# Patient Record
Sex: Female | Born: 1950 | Race: White | Hispanic: No | Marital: Single | Smoking: Current every day smoker
Health system: Southern US, Community
[De-identification: ages and names within clinical notes are randomized; demographics above are authoritative.]

## PROBLEM LIST (undated history)

## (undated) DIAGNOSIS — I10 Essential (primary) hypertension: Secondary | ICD-10-CM

## (undated) DIAGNOSIS — D649 Anemia, unspecified: Secondary | ICD-10-CM

## (undated) DIAGNOSIS — N289 Disorder of kidney and ureter, unspecified: Secondary | ICD-10-CM

## (undated) HISTORY — PX: FRACTURE SURGERY: SHX138

## (undated) HISTORY — PX: TONSILLECTOMY: SUR1361

## (undated) HISTORY — PX: ABDOMINAL HYSTERECTOMY: SHX81

## (undated) HISTORY — PX: CHOLECYSTECTOMY: SHX55

---

## 2013-07-01 DIAGNOSIS — Z90710 Acquired absence of both cervix and uterus: Secondary | ICD-10-CM | POA: Insufficient documentation

## 2014-01-29 DIAGNOSIS — D509 Iron deficiency anemia, unspecified: Secondary | ICD-10-CM | POA: Insufficient documentation

## 2015-03-13 DIAGNOSIS — F5104 Psychophysiologic insomnia: Secondary | ICD-10-CM | POA: Insufficient documentation

## 2015-03-13 DIAGNOSIS — F17210 Nicotine dependence, cigarettes, uncomplicated: Secondary | ICD-10-CM | POA: Insufficient documentation

## 2015-09-11 DIAGNOSIS — I10 Essential (primary) hypertension: Secondary | ICD-10-CM | POA: Insufficient documentation

## 2016-03-02 DIAGNOSIS — E785 Hyperlipidemia, unspecified: Secondary | ICD-10-CM | POA: Insufficient documentation

## 2016-12-18 DIAGNOSIS — F1120 Opioid dependence, uncomplicated: Secondary | ICD-10-CM | POA: Insufficient documentation

## 2017-06-07 ENCOUNTER — Encounter: Payer: Self-pay | Admitting: Emergency Medicine

## 2017-06-07 ENCOUNTER — Emergency Department (INDEPENDENT_AMBULATORY_CARE_PROVIDER_SITE_OTHER)
Admission: EM | Admit: 2017-06-07 | Discharge: 2017-06-07 | Disposition: A | Discharge status: Home / Self Care | Payer: BLUE CROSS/BLUE SHIELD | Source: Home / Self Care | Attending: Family Medicine | Admitting: Family Medicine

## 2017-06-07 ENCOUNTER — Emergency Department (INDEPENDENT_AMBULATORY_CARE_PROVIDER_SITE_OTHER): Payer: BLUE CROSS/BLUE SHIELD

## 2017-06-07 DIAGNOSIS — X501XXA Overexertion from prolonged static or awkward postures, initial encounter: Secondary | ICD-10-CM

## 2017-06-07 DIAGNOSIS — S99929A Unspecified injury of unspecified foot, initial encounter: Secondary | ICD-10-CM

## 2017-06-07 DIAGNOSIS — S92112A Displaced fracture of neck of left talus, initial encounter for closed fracture: Secondary | ICD-10-CM

## 2017-06-07 DIAGNOSIS — S99922A Unspecified injury of left foot, initial encounter: Secondary | ICD-10-CM | POA: Diagnosis not present

## 2017-06-07 DIAGNOSIS — S92152A Displaced avulsion fracture (chip fracture) of left talus, initial encounter for closed fracture: Secondary | ICD-10-CM

## 2017-06-07 HISTORY — DX: Essential (primary) hypertension: I10

## 2017-06-07 NOTE — ED Triage Notes (Signed)
Pt fell off of porch injured left foot, now has swelling and pain

## 2017-06-07 NOTE — Discharge Instructions (Signed)
Apply ice pack for 30 minutes every 1 to 2 hours today and tomorrow.  Elevate.  Use crutches for 3 to 5 days.  Wear Ace wrap until swelling decreases.  Wear brace.  May take Tylenol as needed for pain. Recommend Vitamin D and calcium supplement.

## 2017-06-07 NOTE — ED Provider Notes (Signed)
[facility]    CSN: [id number] Arrival date & time: 06/07/17  1114     History   Chief Complaint Chief Complaint  Patient presents with  . Foot Pain    HPI Laura Bautista is a 66 y.o. female.   Patient twisted her left foot while stepping off a porch step last nigh, resulting in persistent pain in the dorsum of her left foot.   The history is provided by the patient.  Foot Pain  This is a new problem. The current episode started yesterday. The problem occurs constantly. The problem has not changed since onset.The symptoms are aggravated by walking. Nothing relieves the symptoms. Treatments tried: ice pack. The treatment provided mild relief.    Past Medical History:  Diagnosis Date  . Hypertension     There are no active problems to display for this patient.   History reviewed. No pertinent surgical history.  OB History    No data available       Home Medications    Prior to Admission medications   Medication Sig Start Date End Date Taking? Authorizing Provider  ATORVASTATIN CALCIUM PO Take by mouth.   Yes [provider]  Triamterene-HCTZ (MAXZIDE PO) Take by mouth.   Yes [provider]    Family History Family History  Problem Relation Age of Onset  . Heart disease Mother   . Heart disease Father     Social History Social History  Substance Use Topics  . Smoking status: Current Every Day Smoker    Packs/day: 0.50    Types: Cigarettes  . Smokeless tobacco: Never Used  . Alcohol use Yes     Comment: 10 drinks weekly     Allergies   Patient has no known allergies.   Review of Systems Review of Systems  All other systems reviewed and are negative.    Physical Exam Triage Vital Signs ED Triage Vitals [06/07/17 1140]  Enc Vitals Group     BP (!) 146/87     Pulse Rate 71     Resp      Temp 97.3 F (36.3 C)     Temp Source Oral     SpO2 95 %     Weight 180 lb (81.6 kg)     Height 5\' 3"  (1.6 m)   Head Circumference      Peak Flow      Pain Score 5     Pain Loc      Pain Edu?      Excl. in [place]?    No data found.   Updated Vital Signs BP (!) 146/87 (BP Location: Left Arm)   Pulse 71   Temp 97.3 F (36.3 C) (Oral)   Ht 5\' 3"  (1.6 m)   Wt 180 lb (81.6 kg)   SpO2 95%   BMI 31.89 kg/m   Visual Acuity Right Eye Distance:   Left Eye Distance:   Bilateral Distance:    Right Eye Near:   Left Eye Near:    Bilateral Near:     Physical Exam  Constitutional: She appears well-developed and well-nourished. No distress.  HENT:  Head: Atraumatic.  Eyes: Pupils are equal, round, and reactive to light.  Neck: Normal range of motion.  Cardiovascular: Normal rate.   Pulmonary/Chest: Effort normal.  Musculoskeletal:       Left foot: There is decreased range of motion, tenderness, bony tenderness and swelling. There is normal capillary refill, no deformity and no laceration.  Feet:  Dorsum of left foot has mild swelling, ecchymosis, and tenderness to palpation as noted on diagram.  Distal neurovascular function is intact.   Neurological: She is alert.  Skin: Skin is warm and dry.  Nursing note and vitals reviewed.    UC Treatments / Results  Labs (all labs ordered are listed, but only abnormal results are displayed) Labs Reviewed - No data to display  EKG  EKG Interpretation None       Radiology Dg Foot Complete Left  Result Date: 06/07/2017 CLINICAL DATA:  LEFT foot pain EXAM: LEFT FOOT - COMPLETE 3+ VIEW COMPARISON:  None. FINDINGS: On the lateral projection, there is an avulsion fragment dorsal to the neck of the talus. No additional evidence of fracture within the midfoot or forefoot. Calcaneus normal. IMPRESSION: Avulsion fracture along the neck of the talus. Electronically Signed   By: Suzy Bouchard M.D.   On: 06/07/2017 12:01    Procedures Procedures (including critical care time)  Medications Ordered in UC Medications - No data to  display   Initial Impression / Assessment and Plan / UC Course  I have reviewed the triage vital signs and the nursing notes.  Pertinent labs & imaging results that were available during my care of the patient were reviewed by me and considered in my medical decision making (see chart for details).    Ace wrap applied.  Dispensed crutches and cam walker. Apply ice pack for 30 minutes every 1 to 2 hours today and tomorrow.  Elevate.  Use crutches for 3 to 5 days.  Wear Ace wrap until swelling decreases.  Wear brace.  May take Tylenol as needed for pain. Recommend Vitamin D and calcium supplement. Followup with Dr. Aundria Mems or Dr. Lynne Leader ([facility]) as soon as possible.     Final Clinical Impressions(s) / UC Diagnoses   Final diagnoses:  Foot injury  Closed displaced fracture of neck of left talus, initial encounter    New Prescriptions New Prescriptions   No medications on file     Kandra Nicolas, MD 06/07/17 1243

## 2017-06-08 ENCOUNTER — Ambulatory Visit (INDEPENDENT_AMBULATORY_CARE_PROVIDER_SITE_OTHER): Payer: BLUE CROSS/BLUE SHIELD | Admitting: Family Medicine

## 2017-06-08 DIAGNOSIS — S92155A Nondisplaced avulsion fracture (chip fracture) of left talus, initial encounter for closed fracture: Secondary | ICD-10-CM

## 2017-06-08 DIAGNOSIS — S92109A Unspecified fracture of unspecified talus, initial encounter for closed fracture: Secondary | ICD-10-CM | POA: Insufficient documentation

## 2017-06-08 NOTE — Progress Notes (Signed)
   Subjective:    I'm seeing this patient as a consultation for:  Dr Assunta Found  CC: foot fracture  HPI: Laura Bautista is seen today to follow up fracture of the Left foot. Patient suffered an avulsion injury on June 23. She was seen in urgent care on the 24th and diagnosed with an avulsion fracture of the lateral talar neck. She was treated with a cam walker boot and crutches. She presents to clinic today with an Ace wrap on her foot and no boot and no crutches. she notes with compression ice elevation and ibuprofen her symptoms are reasonably well-controlled. she denies fevers or chills vomiting or diarrhea.   Past medical history, Surgical history, Family history not pertinant except as noted below, Social history, Allergies, and medications have been entered into the medical record, reviewed, and no changes needed.   Review of Systems: No headache, visual changes, nausea, vomiting, diarrhea, constipation, dizziness, abdominal pain, skin rash, fevers, chills, night sweats, weight loss, swollen lymph nodes, body aches, joint swelling, muscle aches, chest pain, shortness of breath, mood changes, visual or auditory hallucinations.   Objective:    Vitals:   06/08/17 1346  BP: 130/70  Pulse: 60   General: Well Developed, well nourished, and in no acute distress.  Neuro/Psych: Alert and oriented x3, extra-ocular muscles intact, able to move all 4 extremities, sensation grossly intact. Skin: Warm and dry, no rashes noted.  Respiratory: Not using accessory muscles, speaking in full sentences, trachea midline.  Cardiovascular: Pulses palpable, no extremity edema. Abdomen: Does not appear distended. MSK: Left foot swollen and tender over the dorsal midfoot. Pulses capillary refill and sensation intact distally.  No results found for this or any previous visit (from the past 24 hour(s)). Dg Foot Complete Left  Result Date: 06/07/2017 CLINICAL DATA:  LEFT foot pain EXAM: LEFT FOOT - COMPLETE 3+ VIEW  COMPARISON:  None. FINDINGS: On the lateral projection, there is an avulsion fragment dorsal to the neck of the talus. No additional evidence of fracture within the midfoot or forefoot. Calcaneus normal. IMPRESSION: Avulsion fracture along the neck of the talus. Electronically Signed   By: Suzy Bouchard M.D.   On: 06/07/2017 12:01    Impression and Recommendations:    Assessment and Plan: 66 y.o. female with Talus avulsion fracture. Plan to transition to a cam walker boot with weightbearing as tolerated. Recheck in 2 weeks. Ace wrap as needed as well..   No orders of the defined types were placed in this encounter.  No orders of the defined types were placed in this encounter.   Discussed warning signs or symptoms. Please see discharge instructions. Patient expresses understanding.

## 2017-06-08 NOTE — Patient Instructions (Signed)
Thank you for coming in today. Use the cam walker boot.  Recheck in 2 weeks.  Use ibuprofen for pain as needed.  I recommend over the counter vit D 2000 units daily as well as calcium 1000mg  twice daily.  You can use crutches as needed for pain as needed.    Tarsal Fracture A tarsal fracture is a break in one of the bones that are located in the middle and back of your foot (tarsals). There are seven tarsal bones in your foot that make up your heel, the arch of your foot, and connections to the long bones of your toes. Types of tarsal fractures include:  Cracks in a bone (stress fracture). This is most common in the tarsal bones of the heel and top of the foot.  A completely broken bone that has not moved out of place (nondisplaced fracture).  A completely broken bone that has moved out of place (displaced fracture).  Multiple breaks resulting in three or more bone pieces (comminuted fracture).  What are the causes? This condition may be caused by:  Repeated stress on the tarsal bones over time.  An injury that forcefully twists your foot.  Falling from a height.  A hard, direct hit or a crushing injury to your foot.  What increases the risk? This condition is more likely to develop in people who have weak bones (osteopenia orosteoporosis). You may also be at greater risk for a stress fracture if you start a new athletic activity or try to advance too quickly. Tarsal fractures are also more common in athletes who participate in:  Soccer.  Basketball.  Track or cross country.  Dancing.  Gymnastics.  Football.  What are the signs or symptoms? Foot pain is the most common symptom of this condition. The pain is typically:  Felt in the middle of your foot.  Described as achy, dull, or vague.  Better with rest and worse with activity.  Increased when you lean your body weight on your foot or rise up on tiptoe.  Other symptoms include:  Foot  swelling.  Bruising.  Pain when pressing on the top or bottom of your foot (tenderness).  How is this diagnosed? This condition is diagnosed based on your symptoms and medical history, especially if you recently had an injury. Your health care provider will also do a physical exam to check for bruising, swelling, and painful movement of your foot. You may be asked if you can stand up on tiptoe or hop. Diagnosis is usually confirmed by:  X-rays to check for a complete, displaced, or comminuted fracture.  MRI or another imaging study if your health care provider suspects a stress fracture but cannot see it on an X-ray.  How is this treated? The first treatments for stress fractures and nondisplaced fractures are usually nonsurgical. These may include:  Using a boot or cast to keep your foot still (immobilization) and protect it while it heals.  Using crutches to keep weight off your foot.  Physical therapy to improve motion and strength.  Medicine for pain.  If you have a displaced or comminuted fracture that makes your foot unstable, you may need surgery. This may include using screws, wires, or plates to provide stability. After surgery, you will need to wear a cast and eventually have physical therapy. Follow these instructions at home: If you have a boot:  Wear the boot as told by your health care provider. Remove it only as told by your health care provider.  Loosen the boot if your toes tingle, become numb, or turn cold and blue.  Do not let your boot get wet if it is not waterproof.  Keep the boot clean. If you have a cast:  Do not stick anything inside the cast to scratch your skin. Doing that increases your risk of infection.  Check the skin around the cast every day. Tell your health care provider about any concerns.  You may put lotion on dry skin around the edges of the cast. Do not put lotion on the skin underneath the cast.  Do not let your cast get wet if it is  not waterproof.  Keep the cast clean. Bathing  Do not take baths, swim, or use a hot tub until your health care provider approves. Ask your health care provider if you can take showers. You may only be allowed to take sponge baths for bathing.  If your boot or cast is not waterproof, protect it with a watertight covering when you take a bath or a shower. Managing pain, stiffness, and swelling  If directed, apply ice to the injured area. ? Put ice in a plastic bag. ? Place a towel between the bag and your boot, cast, or skin. ? Leave the ice on for 20 minutes, 2-3 times a day.  Move your toes often to avoid stiffness and to lessen swelling.  Raise (elevate) the injured area above the level of your heart while you are sitting or lying down. Driving  Do not drive or operate heavy machinery while taking prescription pain medicine.  Ask your health care provider when it is safe to drive if you have a boot or cast on your foot. Activity  Return to your normal activities as told by your health care provider. Ask your health care provider what activities are safe for you.  Do exercises as told by your health care provider. Safety  Do not use the injured limb to support your body weight until your health care provider says that you can. Use your crutches as told by your health care provider. General instructions  Do not put pressure on any part of the cast until it is fully hardened. This may take several hours.  Do not use any tobacco products, such as cigarettes, chewing tobacco, and e-cigarettes. Tobacco can delay healing. If you need help quitting, ask your health care provider.  Take over-the-counter medicines, prescription medicines, and vitamin supplements only as told by your health care provider.  Keep all follow-up visits as told by your health care provider. This is important. How is this prevented?  Give your body time to rest between periods of activity.  Wear  comfortable and supportive footwear when being active.  If you are starting a new activity, build your time or distance gradually.  Make sure to use equipment that fits you.  Be safe and responsible while being active to avoid falls.  Maintain physical fitness, including: ? Strength. ? Flexibility.  Participate in alternative physical activities (cross train) to avoid over-stressing one area of your body.  Include foods that contain calcium and vitamin D as part of a balanced diet to support strong bones. These include milk, beef liver, egg yolks, fatty fish, soybeans, dark leafy greens, cheese, and fortified cereals. Contact a health care provider if:  Your pain medicine is not helping.  You cannot do your home care exercises because of pain or stiffness.  Your boot or cast gets damaged. Get help right away if:  You have severe pain.  Your toes turn pale and cold or blue.  You lose feeling in your toes (have numbness).  You have pain, redness, warmth, and tenderness in the back of your leg.  You have chest pain or difficulty breathing. This information is not intended to replace advice given to you by your health care provider. Make sure you discuss any questions you have with your health care provider. Document Released: 12/01/2005 Document Revised: 08/06/2016 Document Reviewed: 11/13/2015 Elsevier Interactive Patient Education  [employer].

## 2017-06-22 ENCOUNTER — Ambulatory Visit (INDEPENDENT_AMBULATORY_CARE_PROVIDER_SITE_OTHER): Payer: BLUE CROSS/BLUE SHIELD

## 2017-06-22 ENCOUNTER — Encounter: Payer: Self-pay | Admitting: Family Medicine

## 2017-06-22 ENCOUNTER — Ambulatory Visit (INDEPENDENT_AMBULATORY_CARE_PROVIDER_SITE_OTHER): Payer: BLUE CROSS/BLUE SHIELD | Admitting: Family Medicine

## 2017-06-22 VITALS — BP 133/69 | HR 79 | Wt 182.0 lb

## 2017-06-22 DIAGNOSIS — S92155D Nondisplaced avulsion fracture (chip fracture) of left talus, subsequent encounter for fracture with routine healing: Secondary | ICD-10-CM

## 2017-06-22 DIAGNOSIS — S92155A Nondisplaced avulsion fracture (chip fracture) of left talus, initial encounter for closed fracture: Secondary | ICD-10-CM | POA: Diagnosis not present

## 2017-06-22 DIAGNOSIS — X58XXXD Exposure to other specified factors, subsequent encounter: Secondary | ICD-10-CM

## 2017-06-22 NOTE — Patient Instructions (Signed)
Thank you for coming in today. Continue the walking boot.  Recheck in 2-3 weeks.  Ok to start to transition to an ankle brace as guided by pain.

## 2017-06-22 NOTE — Progress Notes (Signed)
   Kathyrn Warmuth is a 66 y.o. female who presents to [facility] today for follow-up foot fracture. Patient was seen June 25 for nondisplaced avulsion fracture of the left talus. She is doing quite well and notes minimal pain. She is using the cam walker boot intermittently. At home she will often just use an ASO type brace with a shoe. She notes that bruising and pain are much improved.   Past Medical History:  Diagnosis Date  . Hypertension    No past surgical history on file. Social History  Substance Use Topics  . Smoking status: Current Every Day Smoker    Packs/day: 0.50    Types: Cigarettes  . Smokeless tobacco: Never Used  . Alcohol use Yes     Comment: 10 drinks weekly     ROS:  As above   Medications: Current Outpatient Prescriptions  Medication Sig Dispense Refill  . ATORVASTATIN CALCIUM PO Take by mouth.    . ferrous sulfate 325 (65 FE) MG tablet Take 325 mg by mouth daily.    . Triamterene-HCTZ (MAXZIDE PO) Take by mouth.     No current facility-administered medications for this visit.    No Known Allergies   Exam:  BP 133/69   Pulse 79   Wt 182 lb (82.6 kg)   SpO2 98%   BMI 32.24 kg/m  General: Well Developed, well nourished, and in no acute distress.  Neuro/Psych: Alert and oriented x3, extra-ocular muscles intact, able to move all 4 extremities, sensation grossly intact. Skin: Warm and dry, no rashes noted.  Respiratory: Not using accessory muscles, speaking in full sentences, trachea midline.  Cardiovascular: Pulses palpable, no extremity edema. Abdomen: Does not appear distended. MSK: Left foot: Resolving ecchymosis. Nontender. Some pain with eversion. Pulses capillary refill and sensation are intact.  X-ray left foot shows stable appearance of avulsion fracture with no significant healing. Awaiting formal radiology review.    No results found for this or any previous visit (from the past 48  hour(s)). No results found.    Assessment and Plan: 66 y.o. female with left foot talus fracture. Plan to continue the boot as needed. Slowly transition to ASO. Recheck in 2-3 weeks.    Orders Placed This Encounter  Procedures  . DG Foot Complete Left    Standing Status:   Future    Number of Occurrences:   1    Standing Expiration Date:   08/23/2018    Order Specific Question:   Reason for Exam (SYMPTOM  OR DIAGNOSIS REQUIRED)    Answer:   f.u x    Order Specific Question:   Preferred imaging location?    Answer:   [facility]    Order Specific Question:   Radiology Contrast Protocol - do NOT remove file path    Answer:   [email or web address]   Meds ordered this encounter  Medications  . ferrous sulfate 325 (65 FE) MG tablet    Sig: Take 325 mg by mouth daily.    Discussed warning signs or symptoms. Please see discharge instructions. Patient expresses understanding.

## 2017-07-06 ENCOUNTER — Ambulatory Visit (INDEPENDENT_AMBULATORY_CARE_PROVIDER_SITE_OTHER): Payer: BLUE CROSS/BLUE SHIELD | Admitting: Family Medicine

## 2017-07-06 ENCOUNTER — Ambulatory Visit (INDEPENDENT_AMBULATORY_CARE_PROVIDER_SITE_OTHER): Payer: BLUE CROSS/BLUE SHIELD

## 2017-07-06 ENCOUNTER — Encounter: Payer: Self-pay | Admitting: Family Medicine

## 2017-07-06 VITALS — BP 132/75 | HR 69 | Wt 182.0 lb

## 2017-07-06 DIAGNOSIS — S92155D Nondisplaced avulsion fracture (chip fracture) of left talus, subsequent encounter for fracture with routine healing: Secondary | ICD-10-CM | POA: Diagnosis not present

## 2017-07-06 DIAGNOSIS — S92155A Nondisplaced avulsion fracture (chip fracture) of left talus, initial encounter for closed fracture: Secondary | ICD-10-CM

## 2017-07-06 DIAGNOSIS — X501XXD Overexertion from prolonged static or awkward postures, subsequent encounter: Secondary | ICD-10-CM | POA: Diagnosis not present

## 2017-07-06 NOTE — Patient Instructions (Signed)
Thank you for coming in today. Things are looking good.  Continue the ankle brace with activity for 1 month.  Recheck in 1 month.  Work on ankle motion exercises at home.     Ankle Sprain, Phase I Rehab Ask your health care provider which exercises are safe for you. Do exercises exactly as told by your health care provider and adjust them as directed. It is normal to feel mild stretching, pulling, tightness, or discomfort as you do these exercises, but you should stop right away if you feel sudden pain or your pain gets worse.Do not begin these exercises until told by your health care provider. Stretching and range of motion exercises These exercises warm up your muscles and joints and improve the movement and flexibility of your lower leg and ankle. These exercises also help to relieve pain and stiffness. Exercise A: Gastroc and soleus stretch  1. Sit on the floor with your left / right leg extended. 2. Loop a belt or towel around the ball of your left / right foot. The ball of your foot is on the walking surface, right under your toes. 3. Keep your left / right ankle and foot relaxed and keep your knee straight while you use the belt or towel to pull your foot toward you. You should feel a gentle stretch behind your calf or knee. 4. Hold this position for __________ seconds, then release to the starting position. Repeat the exercise with your knee bent. You can put a pillow or a rolled bath towel under your knee to support it. You should feel a stretch deep in your calf or at your Achilles tendon. Repeat each stretch __________ times. Complete these stretches __________ times a day. Exercise B: Ankle alphabet  1. Sit with your left / right leg supported at the lower leg. ? Do not rest your foot on anything. ? Make sure your foot has room to move freely. 2. Think of your left / right foot as a paintbrush, and move your foot to trace each letter of the alphabet in the air. Keep your hip and  knee still while you trace. Make the letters as large as you can without feeling discomfort. 3. Trace every letter from A to Z. Repeat __________ times. Complete this exercise __________ times a day. Strengthening exercises These exercises build strength and endurance in your ankle and lower leg. Endurance is the ability to use your muscles for a long time, even after they get tired. Exercise C: Dorsiflexors  1. Secure a rubber exercise band or tube to an object, such as a table leg, that will stay still when the band is pulled. Secure the other end around your left / right foot. 2. Sit on the floor facing the object, with your left / right leg extended. The band or tube should be slightly tense when your foot is relaxed. 3. Slowly bring your foot toward you, pulling the band tighter. 4. Hold this position for __________ seconds. 5. Slowly return your foot to the starting position. Repeat __________ times. Complete this exercise __________ times a day. Exercise D: Plantar flexors  1. Sit on the floor with your left / right leg extended. 2. Loop a rubber exercise tube or band around the ball of your left / right foot. The ball of your foot is on the walking surface, right under your toes. ? Hold the ends of the band or tube in your hands. ? The band or tube should be slightly tense when  your foot is relaxed. 3. Slowly point your foot and toes downward, pushing them away from you. 4. Hold this position for __________ seconds. 5. Slowly return your foot to the starting position. Repeat __________ times. Complete this exercise __________ times a day. Exercise E: Evertors 1. Sit on the floor with your legs straight out in front of you. 2. Loop a rubber exercise band or tube around the ball of your left / right foot. The ball of your foot is on the walking surface, right under your toes. ? Hold the ends of the band in your hands, or secure the band to a stable object. ? The band or tube should  be slightly tense when your foot is relaxed. 3. Slowly push your foot outward, away from your other leg. 4. Hold this position for __________ seconds. 5. Slowly return your foot to the starting position. Repeat __________ times. Complete this exercise __________ times a day. This information is not intended to replace advice given to you by your health care provider. Make sure you discuss any questions you have with your health care provider. Document Released: 07/02/2005 Document Revised: 08/07/2016 Document Reviewed: 10/15/2015 Elsevier Interactive Patient Education  2018 [employer].

## 2017-07-06 NOTE — Progress Notes (Signed)
   Laura Bautista is a 66 y.o. female who presents to [facility] today for follow-up left anterior talus avulsion fracture.  Patient was originally seen June 24 for anterior talus avulsion fracture. In the last several weeks she's been using an ASO ankle brace. She notes mild soreness but overall feels much better. She denies any repeat injury or significant swelling.   Past Medical History:  Diagnosis Date  . Hypertension    No past surgical history on file. Social History  Substance Use Topics  . Smoking status: Current Every Day Smoker    Packs/day: 0.50    Types: Cigarettes  . Smokeless tobacco: Never Used  . Alcohol use Yes     Comment: 10 drinks weekly     ROS:  As above   Medications: Current Outpatient Prescriptions  Medication Sig Dispense Refill  . ATORVASTATIN CALCIUM PO Take by mouth.    . ferrous sulfate 325 (65 FE) MG tablet Take 325 mg by mouth daily.    . Triamterene-HCTZ (MAXZIDE PO) Take by mouth.     No current facility-administered medications for this visit.    No Known Allergies   Exam:  BP 132/75   Pulse 69   Wt 182 lb (82.6 kg)   SpO2 98%   BMI 32.24 kg/m  General: Well Developed, well nourished, and in no acute distress.  Neuro/Psych: Alert and oriented x3, extra-ocular muscles intact, able to move all 4 extremities, sensation grossly intact. Skin: Warm and dry, no rashes noted.  Respiratory: Not using accessory muscles, speaking in full sentences, trachea midline.  Cardiovascular: Pulses palpable, no extremity edema. Abdomen: Does not appear distended. MSK: Left ankle no swelling nontender normal motion stable ligamentous exam. Pulses capillary refill and sensation intact.   X-ray left foot shows healing with visible fracture line anterior talus avulsion fragment. Awaiting formal radiology review.   Assessment and Plan: 66 y.o. female with healing anterior talus avulsion fracture. Doing  well. Continue ASO brace. Work on home range of motion exercises. Recheck in one month.    Orders Placed This Encounter  Procedures  . DG Foot Complete Left    Standing Status:   Future    Number of Occurrences:   1    Standing Expiration Date:   09/06/2018    Order Specific Question:   Reason for Exam (SYMPTOM  OR DIAGNOSIS REQUIRED)    Answer:   eval fx    Order Specific Question:   Preferred imaging location?    Answer:   [facility]    Order Specific Question:   Radiology Contrast Protocol - do NOT remove file path    Answer:   [email or web address]   No orders of the defined types were placed in this encounter.   Discussed warning signs or symptoms. Please see discharge instructions. Patient expresses understanding.

## 2017-08-06 ENCOUNTER — Ambulatory Visit: Payer: BLUE CROSS/BLUE SHIELD | Admitting: Family Medicine

## 2017-08-14 DIAGNOSIS — M8580 Other specified disorders of bone density and structure, unspecified site: Secondary | ICD-10-CM | POA: Insufficient documentation

## 2017-08-15 IMAGING — DX DG FOOT COMPLETE 3+V*L*
3 series · 3 of 3 positions shown · non-contrast
Comparison: Prior radiographs 06/22/2017 and 06/07/2017

CLINICAL DATA: 65-year-old female with a history of left talus
fracture

EXAM:
LEFT FOOT - COMPLETE 3+ VIEW

[foot ap]
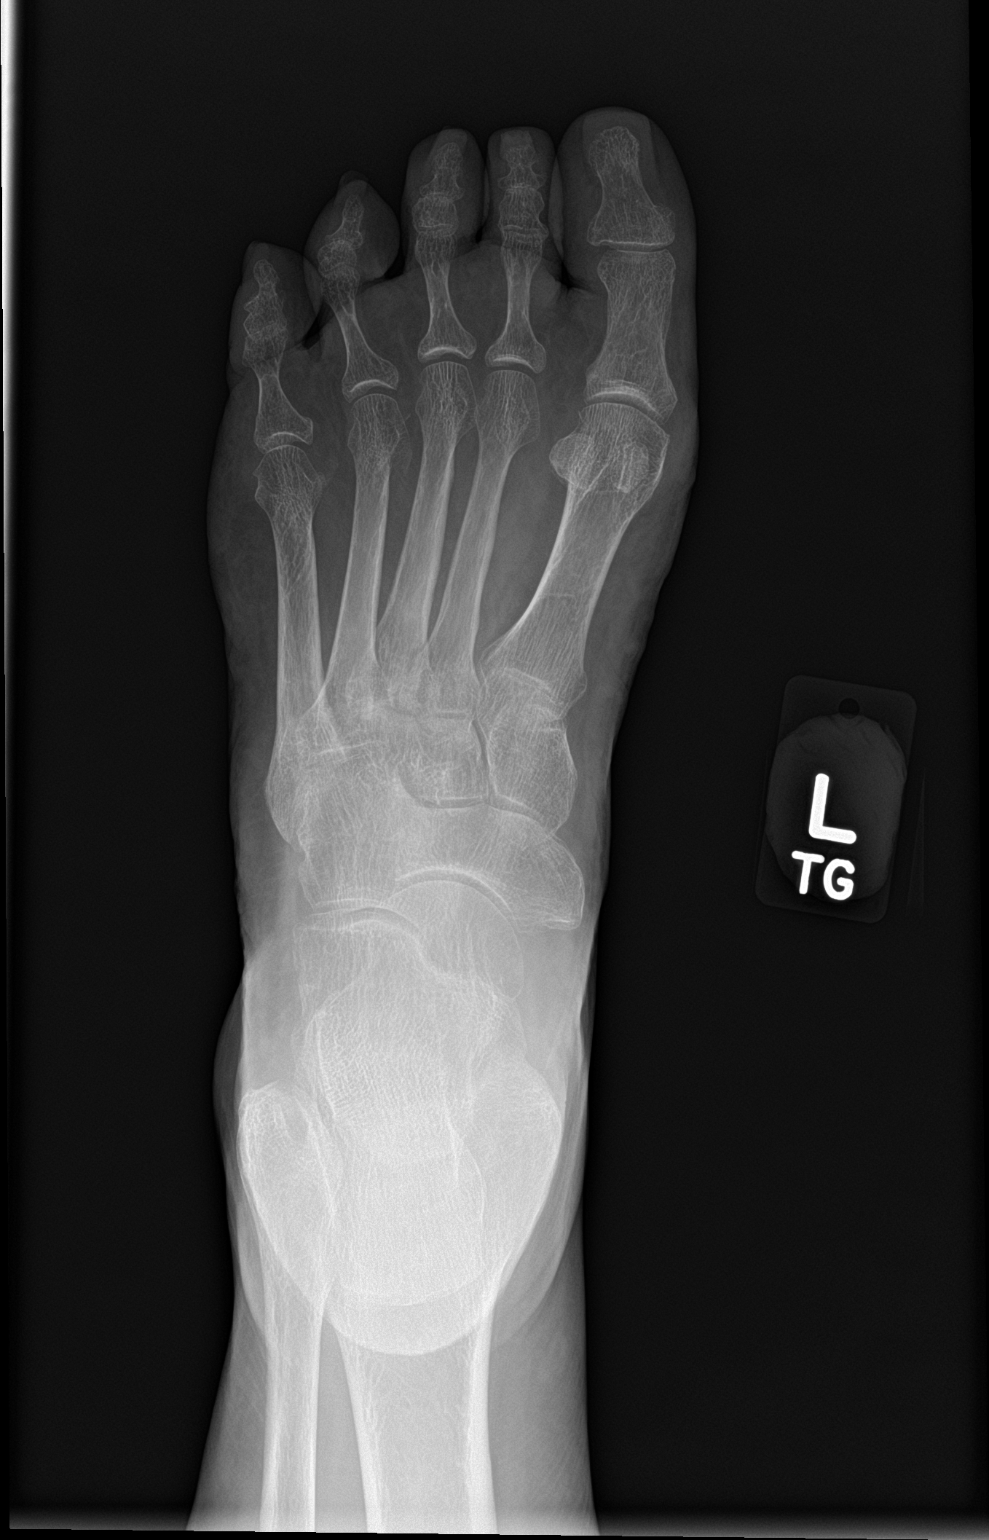

[foot obl]
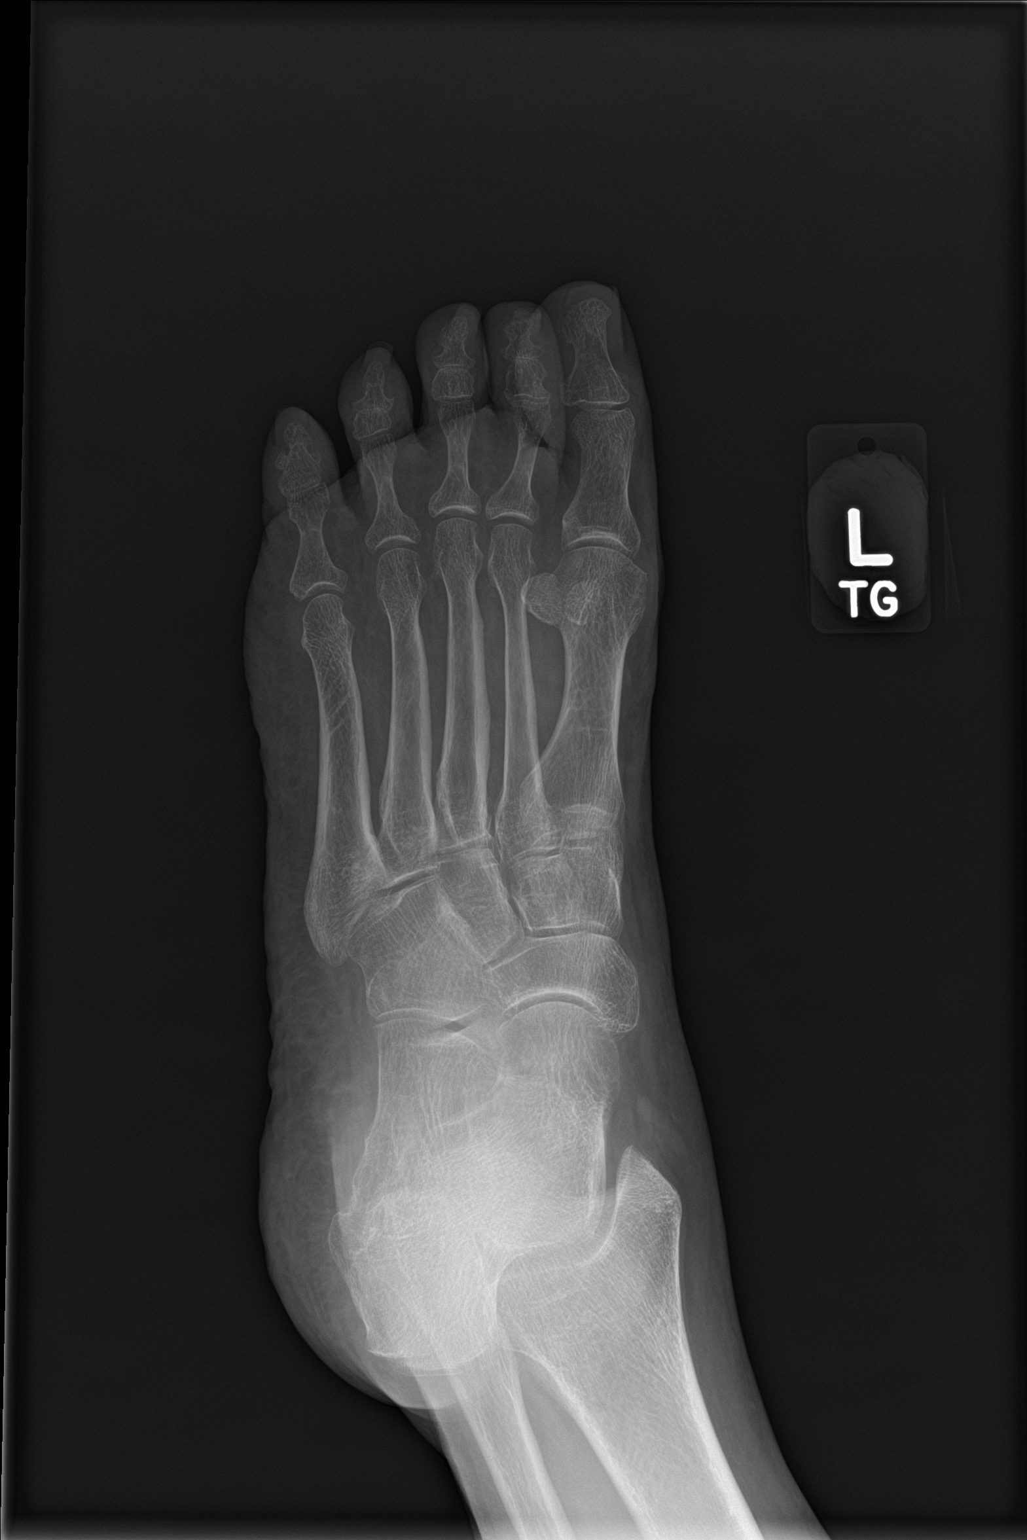

[foot lat]
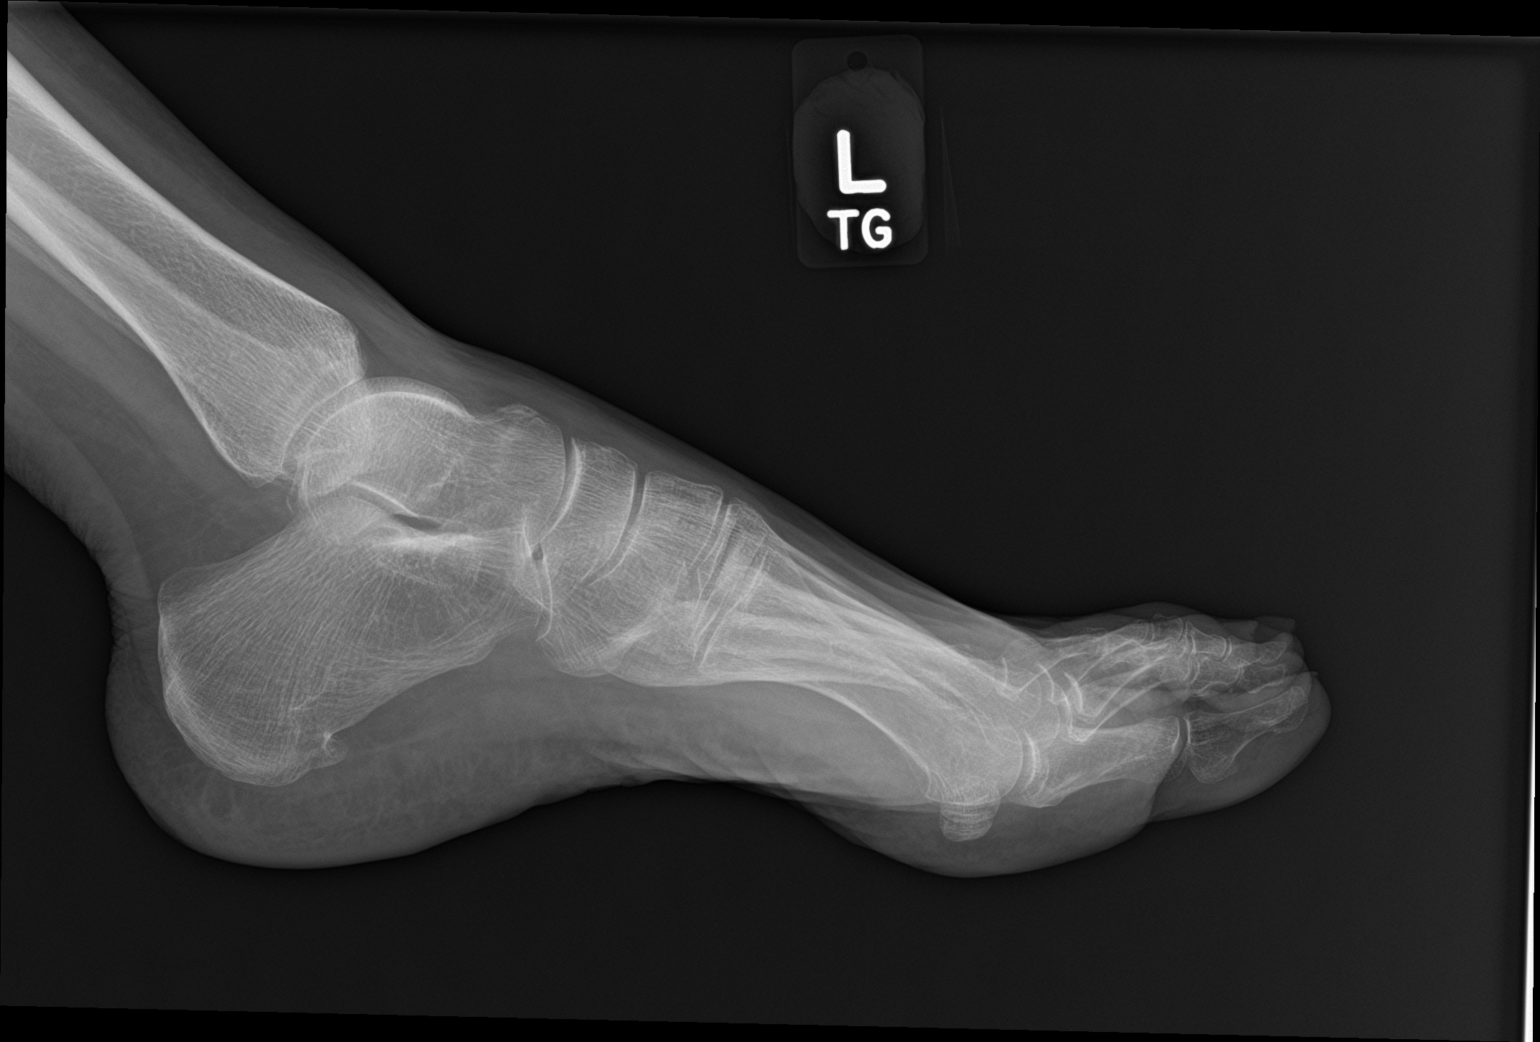

[3 of 3 positions shown; findings below may reference images not displayed]

FINDINGS: Healing avulsion fracture from the dorsal aspect of the neck of the
talus. No new fracture or malalignment identified. No focal soft
tissue swelling or abnormality. No lytic or blastic osseous lesion.
IMPRESSION: Healing avulsion fracture from the dorsal aspect of the neck of the
talus.

## 2018-08-12 DIAGNOSIS — E559 Vitamin D deficiency, unspecified: Secondary | ICD-10-CM | POA: Insufficient documentation

## 2019-07-27 DIAGNOSIS — C44310 Basal cell carcinoma of skin of unspecified parts of face: Secondary | ICD-10-CM | POA: Insufficient documentation

## 2019-10-07 ENCOUNTER — Other Ambulatory Visit: Payer: Self-pay

## 2019-10-07 DIAGNOSIS — Z20822 Contact with and (suspected) exposure to covid-19: Secondary | ICD-10-CM

## 2019-10-08 LAB — NOVEL CORONAVIRUS, NAA: SARS-CoV-2, NAA: NOT DETECTED

## 2020-02-01 DIAGNOSIS — N183 Chronic kidney disease, stage 3 unspecified: Secondary | ICD-10-CM | POA: Insufficient documentation

## 2020-09-13 ENCOUNTER — Other Ambulatory Visit: Payer: Self-pay

## 2020-09-13 ENCOUNTER — Emergency Department (INDEPENDENT_AMBULATORY_CARE_PROVIDER_SITE_OTHER)
Admission: EM | Admit: 2020-09-13 | Discharge: 2020-09-13 | Disposition: A | Discharge status: Home / Self Care | Payer: Medicare Other | Source: Home / Self Care

## 2020-09-13 ENCOUNTER — Encounter: Payer: Self-pay | Admitting: Emergency Medicine

## 2020-09-13 DIAGNOSIS — R519 Headache, unspecified: Secondary | ICD-10-CM

## 2020-09-13 DIAGNOSIS — R52 Pain, unspecified: Secondary | ICD-10-CM

## 2020-09-13 DIAGNOSIS — R110 Nausea: Secondary | ICD-10-CM

## 2020-09-13 DIAGNOSIS — M1990 Unspecified osteoarthritis, unspecified site: Secondary | ICD-10-CM | POA: Insufficient documentation

## 2020-09-13 DIAGNOSIS — Z20822 Contact with and (suspected) exposure to covid-19: Secondary | ICD-10-CM

## 2020-09-13 HISTORY — DX: Disorder of kidney and ureter, unspecified: N28.9

## 2020-09-13 HISTORY — DX: Anemia, unspecified: D64.9

## 2020-09-13 MED ORDER — ONDANSETRON 4 MG PO TBDP
4.0000 mg | ORAL_TABLET | Freq: Once | ORAL | Status: AC
  Administered 2020-09-13: 4 mg via ORAL

## 2020-09-13 NOTE — Discharge Instructions (Signed)
You may take 500mg  acetaminophen every 4-6 hours or in combination with ibuprofen 400-600mg  every 6-8 hours as needed for pain, inflammation, and fever.  Be sure to well hydrated with clear liquids and get at least 8 hours of sleep at night, preferably more while sick.   Please follow up with family medicine in 1 week if needed.  Due to concern for possibly having Covid-19, it is advised that you self-isolate at home until test results come back, usually 2-3 days.  If positive, it is recommended you stay isolated for at least 10 days after symptom onset and 24 hours after last fever without taking medication (whichever is longer).  If you MUST go out, please wear a mask at all times, limit contact with others.   If your test is negative, you still have plenty of time to get the Covid vaccine. It is recommended you schedule an appointment to get your vaccine once you get over this current illness.  Please ask your primary care provider about any questions/concerns related to the vaccine.

## 2020-09-13 NOTE — ED Triage Notes (Addendum)
Chills & body aches since yesterday afternoon Nausea intermittent today Had chicken noodle soup today - no emesis Tmax was 100.9 Denies sore throat Headache this am - took 325mg  ASA Rapid Covid test at [facility] drive thru today was negative - no PCR per pt NO COVID vaccine

## 2020-09-13 NOTE — ED Provider Notes (Signed)
Laura Bautista [facility]    CSN: [id number] Arrival date & time: 09/13/20  1021      History   Chief Complaint Chief Complaint  Patient presents with  . Chills  . Fever  . Generalized Body Aches  . Nausea    HPI Laura Bautista is a 69 y.o. female.   HPI Laura Bautista is a 69 y.o. female presenting to UC with c/o sudden onset body aches and chills since yesterday afternoon. Mild intermittent nausea.  She was able to keep down some chicken noodle soup this morning.  Fever Tmax 100.9*F this morning.  Mild generalized HA. She took 325mg  ASA which did provide relief.  Rapid COVID test at [facility] drive through was negative.  She has not had the COVID vaccine. Denies chest pain or SOB. Denies vomiting or diarrhea. No sick contacts.   Past Medical History:  Diagnosis Date  . Anemia   . Hypertension   . Kidney disease     Patient Active Problem List   Diagnosis Date Noted  . Arthritis 09/13/2020  . Chronic kidney disease (CKD), stage III (moderate) ([place]) 02/01/2020  . BCC (basal cell carcinoma), face 07/27/2019  . Vitamin D deficiency 08/12/2018  . Osteopenia 08/14/2017  . Talus fracture 06/08/2017  . Opioid type dependence, continuous ([place]) 12/18/2016  . Dyslipidemia 03/02/2016  . Benign essential hypertension 09/11/2015  . Chronic insomnia 03/13/2015  . Nicotine dependence, cigarettes, uncomplicated 85/46/2703  . Iron deficiency anemia 01/29/2014  . H/O hysterectomy for benign disease 07/01/2013    Past Surgical History:  Procedure Laterality Date  . ABDOMINAL HYSTERECTOMY    . CHOLECYSTECTOMY    . FRACTURE SURGERY Right    foot/ankle  . TONSILLECTOMY      OB History   No obstetric history on file.      Home Medications    Prior to Admission medications   Medication Sig Start Date End Date Taking? Authorizing Provider  ATORVASTATIN CALCIUM PO Take by mouth.   Yes [provider]  Calcium Carbonate-Vitamin D 600-400 MG-UNIT tablet Take by mouth.  08/14/17  Yes [provider]  Cholecalciferol 50 MCG (2000 UT) TABS Take by mouth daily. 08/12/18  Yes [provider]  ferrous sulfate 325 (65 FE) MG tablet Take 325 mg by mouth daily. 01/11/16  Yes [provider]  hydrochlorothiazide (HYDRODIURIL) 25 MG tablet Take by mouth. 07/24/20  Yes [provider]  HYDROcodone-acetaminophen (NORCO) 7.5-325 MG tablet Take 1 tablet by mouth every 6 (six) hours as needed. 08/30/20 09/29/20 Yes [provider]  lisinopril (ZESTRIL) 10 MG tablet Take 1 tablet by mouth daily. 04/30/20  Yes [provider]  Multiple Vitamin (THERA) TABS Take 1 tablet by mouth daily.   Yes [provider]  amLODipine (NORVASC) 5 MG tablet Take 5 mg by mouth daily. 08/02/20   [provider]  Triamterene-HCTZ (MAXZIDE PO) Take by mouth.    [provider]    Family History Family History  Problem Relation Age of Onset  . Heart disease Mother   . Heart disease Father   . Cancer Sister     Social History Social History   Tobacco Use  . Smoking status: Current Every Day Smoker    Packs/day: 0.50    Types: Cigarettes  . Smokeless tobacco: Never Used  Vaping Use  . Vaping Use: Never used  Substance Use Topics  . Alcohol use: Yes    Comment: 10 drinks weekly  . Drug use: Not on file  Allergies   Patient has no known allergies.   Review of Systems Review of Systems  Constitutional: Positive for chills and fever.  HENT: Negative for congestion, ear pain, sore throat, trouble swallowing and voice change.   Respiratory: Negative for cough and shortness of breath.   Cardiovascular: Negative for chest pain and palpitations.  Gastrointestinal: Positive for nausea. Negative for abdominal pain, diarrhea and vomiting.  Musculoskeletal: Positive for arthralgias, back pain and myalgias.  Skin: Negative for rash.  Neurological: Positive for headaches. Negative for dizziness and  light-headedness.  All other systems reviewed and are negative.    Physical Exam Triage Vital Signs ED Triage Vitals  Enc Vitals Group     BP 09/13/20 1049 100/65     Pulse Rate 09/13/20 1049 90     Resp 09/13/20 1049 18     Temp 09/13/20 1049 99 F (37.2 C)     Temp Source 09/13/20 1049 Oral     SpO2 09/13/20 1049 98 %     Weight 09/13/20 1055 167 lb (75.8 kg)     Height 09/13/20 1055 5\' 3"  (1.6 m)     Head Circumference --      Peak Flow --      Pain Score 09/13/20 1055 5     Pain Loc --      Pain Edu? --      Excl. in [place]? --    No data found.  Updated Vital Signs BP 100/65 (BP Location: Right Arm)   Pulse 90   Temp 99 F (37.2 C) (Oral)   Resp 18   Ht 5\' 3"  (1.6 m)   Wt 167 lb (75.8 kg)   SpO2 98%   BMI 29.58 kg/m   Visual Acuity Right Eye Distance:   Left Eye Distance:   Bilateral Distance:    Right Eye Near:   Left Eye Near:    Bilateral Near:     Physical Exam Vitals and nursing note reviewed.  Constitutional:      General: She is not in acute distress.    Appearance: Normal appearance. She is well-developed. She is not ill-appearing, toxic-appearing or diaphoretic.  HENT:     Head: Normocephalic and atraumatic.     Right Ear: Tympanic membrane and ear canal normal.     Left Ear: Tympanic membrane and ear canal normal.     Nose: Nose normal.     Mouth/Throat:     Mouth: Mucous membranes are moist.     Pharynx: Oropharynx is clear.  Cardiovascular:     Rate and Rhythm: Normal rate and regular rhythm.  Pulmonary:     Effort: Pulmonary effort is normal. No respiratory distress.     Breath sounds: Normal breath sounds. No stridor. No wheezing, rhonchi or rales.  Abdominal:     General: There is no distension.     Palpations: Abdomen is soft.     Tenderness: There is no abdominal tenderness.  Musculoskeletal:        General: Normal range of motion.     Cervical back: Normal range of motion and neck supple. No tenderness.  Lymphadenopathy:      Cervical: No cervical adenopathy.  Skin:    General: Skin is warm and dry.  Neurological:     Mental Status: She is alert and oriented to person, place, and time.  Psychiatric:        Behavior: Behavior normal.      UC Treatments / Results  Labs (all labs ordered  are listed, but only abnormal results are displayed) Labs Reviewed  NOVEL CORONAVIRUS, NAA    EKG   Radiology No results found.  Procedures Procedures (including critical care time)  Medications Ordered in UC Medications  ondansetron (ZOFRAN-ODT) disintegrating tablet 4 mg (4 mg Oral Given 09/13/20 1111)    Initial Impression / Assessment and Plan / UC Course  I have reviewed the triage vital signs and the nursing notes.  Pertinent labs & imaging results that were available during my care of the patient were reviewed by me and considered in my medical decision making (see chart for details).     No evidence of bacterial infection at this time Hx and exam c/w viral URI COVID PCR test pending Encouraged symptomatic tx  AVS given  Final Clinical Impressions(s) / UC Diagnoses   Final diagnoses:  Exposure to COVID-19 virus  Body aches  Nausea without vomiting  Generalized headache     Discharge Instructions     You may take 500mg  acetaminophen every 4-6 hours or in combination with ibuprofen 400-600mg  every 6-8 hours as needed for pain, inflammation, and fever.  Be sure to well hydrated with clear liquids and get at least 8 hours of sleep at night, preferably more while sick.   Please follow up with family medicine in 1 week if needed.  Due to concern for possibly having Covid-19, it is advised that you self-isolate at home until test results come back, usually 2-3 days.  If positive, it is recommended you stay isolated for at least 10 days after symptom onset and 24 hours after last fever without taking medication (whichever is longer).  If you MUST go out, please wear a mask at all times, limit  contact with others.   If your test is negative, you still have plenty of time to get the Covid vaccine. It is recommended you schedule an appointment to get your vaccine once you get over this current illness.  Please ask your primary care provider about any questions/concerns related to the vaccine.      ED Prescriptions    None     PDMP not reviewed this encounter.   Noe Gens, [place] 09/13/20 1206

## 2020-09-15 LAB — SARS-COV-2, NAA 2 DAY TAT

## 2020-09-15 LAB — NOVEL CORONAVIRUS, NAA: SARS-CoV-2, NAA: NOT DETECTED
